# Patient Record
Sex: Male | Born: 1975 | Race: White | Hispanic: No | Marital: Married | Smoking: Current every day smoker
Health system: Southern US, Community
[De-identification: ages and names within clinical notes are randomized; demographics above are authoritative.]

## PROBLEM LIST (undated history)

## (undated) DIAGNOSIS — R569 Unspecified convulsions: Secondary | ICD-10-CM

## (undated) HISTORY — PX: SHOULDER SURGERY: SHX246

## (undated) HISTORY — PX: FRACTURE SURGERY: SHX138

## (undated) HISTORY — PX: BRAIN SURGERY: SHX531

## (undated) HISTORY — PX: BACK SURGERY: SHX140

---

## 2015-12-13 DIAGNOSIS — M25312 Other instability, left shoulder: Secondary | ICD-10-CM | POA: Insufficient documentation

## 2017-06-09 DIAGNOSIS — G4700 Insomnia, unspecified: Secondary | ICD-10-CM | POA: Insufficient documentation

## 2018-08-25 ENCOUNTER — Other Ambulatory Visit: Payer: Self-pay

## 2018-08-25 ENCOUNTER — Emergency Department (INDEPENDENT_AMBULATORY_CARE_PROVIDER_SITE_OTHER): Payer: Commercial Managed Care - PPO

## 2018-08-25 ENCOUNTER — Emergency Department
Admission: EM | Admit: 2018-08-25 | Discharge: 2018-08-25 | Disposition: A | Discharge status: Home / Self Care | Payer: Commercial Managed Care - PPO | Source: Home / Self Care

## 2018-08-25 DIAGNOSIS — S67196A Crushing injury of right little finger, initial encounter: Secondary | ICD-10-CM

## 2018-08-25 DIAGNOSIS — X58XXXA Exposure to other specified factors, initial encounter: Secondary | ICD-10-CM

## 2018-08-25 DIAGNOSIS — Z23 Encounter for immunization: Secondary | ICD-10-CM

## 2018-08-25 HISTORY — DX: Unspecified convulsions: R56.9

## 2018-08-25 MED ORDER — TETANUS-DIPHTH-ACELL PERTUSSIS 5-2.5-18.5 LF-MCG/0.5 IM SUSP
0.5000 mL | Freq: Once | INTRAMUSCULAR | Status: AC
  Administered 2018-08-25: 0.5 mL via INTRAMUSCULAR

## 2018-08-25 MED ORDER — HYDROCODONE-ACETAMINOPHEN 5-325 MG PO TABS: 1.0000 | ORAL_TABLET | Freq: Four times a day (QID) | ORAL | 0 refills | Status: DC | PRN

## 2018-08-25 MED ORDER — DOXYCYCLINE HYCLATE 100 MG PO CAPS: 100.0000 mg | ORAL_CAPSULE | Freq: Two times a day (BID) | ORAL | 0 refills | Status: AC

## 2018-08-25 NOTE — ED Triage Notes (Signed)
Pinky finger on right hand was caught in metal and split it open on Sunday.

## 2018-08-25 NOTE — ED Provider Notes (Signed)
[facility]    CSN: [id number] Arrival date & time: 08/25/18  [id number]     History   Chief Complaint Chief Complaint  Patient presents with   Finger Injury    HPI Micheal Campbell is a 43 y.o. male.   HPI Micheal Campbell is a 43 y.o. male presenting to UC with c/o gradually worsening Right little finger pain and swelling after getting his finger caught in a large metal sheet and screw 2 days ago. Pt states the screw essentially went between his finger and under his nail. He did try to gently clean the wound and applied an OTC steri-strip but his wife encouraged him to be evaluated today.  Pain is 3/10 at rest but sever whenever his finger is touched or even rinsed.  Denies fever, chills, n/v/d. He is Right hand dominant. [place].  He is unsure of his last tetanus.   Past Medical History:  Diagnosis Date   Seizures (HCC)     There are no active problems to display for this patient.   Past Surgical History:  Procedure Laterality Date   BACK SURGERY     BRAIN SURGERY     FRACTURE SURGERY     SHOULDER SURGERY         Home Medications    Prior to Admission medications   Medication Sig Start Date End Date Taking? Authorizing Provider  carbamazepine (TEGRETOL) 200 MG tablet take 1-2 tablet by mouth in the morning and 2 tablet by mouth in the evening DAILY 07/13/18   [provider]  clonazePAM (KLONOPIN) 0.5 MG tablet take 1 and one half tablet by mouth every day as needed 07/10/18   [provider]  doxycycline (VIBRAMYCIN) 100 MG capsule Take 1 capsule (100 mg total) by mouth 2 (two) times daily for 10 days. 08/25/18 09/04/18  Lurene ShadowPhelps, Missael Ferrari O, PA-C  HYDROcodone-acetaminophen (NORCO/VICODIN) 5-325 MG tablet Take 1-2 tablets by mouth every 6 (six) hours as needed. 08/25/18   Lurene ShadowPhelps, Mandeep Ferch O, PA-C  ibuprofen (ADVIL) 800 MG tablet take 1 tablet EVERY 8-12 hours as needed for elbow pain] 07/10/18    [provider]    Family History History reviewed. No pertinent family history.  Social History Social History   Tobacco Use   Smoking status: Current Every Day Smoker    Packs/day: 0.50   Smokeless tobacco: Never Used  Substance Use Topics   Alcohol use: Yes    Comment: 6 pack a week   Drug use: Not on file     Allergies   Patient has no known allergies.   Review of Systems Review of Systems  Musculoskeletal: Positive for arthralgias and joint swelling. Negative for myalgias.  Skin: Positive for color change and wound.  Neurological: Positive for weakness (Right little finger due to pain). Negative for numbness.     Physical Exam Triage Vital Signs ED Triage Vitals  Enc Vitals Group     BP 08/25/18 0909 (!) 141/80     Pulse Rate 08/25/18 0909 (!) 59     Resp 08/25/18 0909 20     Temp 08/25/18 0909 98.3 F (36.8 C)     Temp Source 08/25/18 0909 Oral     SpO2 08/25/18 0909 98 %     Weight 08/25/18 0912 223 lb (101.2 kg)     Height 08/25/18 0912 6\' 6"  (1.981 m)     Head Circumference --      Peak  Flow --      Pain Score 08/25/18 0911 3     Pain Loc --      Pain Edu? --      Excl. in [place]? --    No data found.  Updated Vital Signs BP (!) 141/80 (BP Location: Right Arm)    Pulse (!) 59    Temp 98.3 F (36.8 C) (Oral)    Resp 20    Ht 6\' 6"  (1.981 m)    Wt 223 lb (101.2 kg)    SpO2 98%    BMI 25.77 kg/m   Visual Acuity Right Eye Distance:   Left Eye Distance:   Bilateral Distance:    Right Eye Near:   Left Eye Near:    Bilateral Near:     Physical Exam Vitals signs and nursing note reviewed.  Constitutional:      Appearance: Normal appearance. He is well-developed.  HENT:     Head: Normocephalic and atraumatic.  Neck:     Musculoskeletal: Normal range of motion.  Cardiovascular:     Rate and Rhythm: Normal rate.  Pulmonary:     Effort: Pulmonary effort is normal.  Musculoskeletal:        General: Swelling, tenderness and signs of  injury present.     Right hand: He exhibits deformity. He exhibits normal capillary refill.       Hands:     Comments: Right little finger: moderate edema to distal aspect. Slight decreased flexion due to pain. Severe tenderness to distal aspect.   Skin:    General: Skin is warm and dry.     Capillary Refill: Capillary refill takes less than 2 seconds.     Comments: Right little finger, distal aspect: superficial laceration. No active bleeding. Tip of finger covered with dried grease/oil.  Significant tenderness around wound. No erythema or warmth.  Nail bed in tact.  Neurological:     General: No focal deficit present.     Mental Status: He is alert and oriented to person, place, and time.  Psychiatric:        Behavior: Behavior normal.      UC Treatments / Results  Labs (all labs ordered are listed, but only abnormal results are displayed) Labs Reviewed - No data to display  EKG   Radiology Dg Hand Complete Right  Result Date: 08/25/2018 CLINICAL DATA:  Laceration to fifth digit with pain and swelling, initial encounter EXAM: RIGHT HAND - COMPLETE 3+ VIEW COMPARISON:  None. 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 No acute fracture is seen. No dislocation is noted. No gross soft tissue abnormality or foreign body is seen. IMPRESSION: No acute abnormality noted. Electronically Signed   By: Inez Catalina M.D.   On: 08/25/2018 09:46    Procedures Laceration Repair  Date/Time: 08/25/2018 12:03 PM Performed by: Noe Gens, PA-C Authorized by: Noe Gens, PA-C   Consent:    Consent obtained:  Verbal   Consent given by:  Patient   Risks discussed:  Infection, pain, retained foreign body, poor cosmetic result, tendon damage, need for additional repair and poor wound healing   Alternatives discussed:  No treatment and delayed treatment Anesthesia (see MAR for exact dosages):    Anesthesia method:  None Laceration details:      Location:  Finger   Finger location:  R small finger   Length (cm):  2   Depth (mm):  2 Repair type:    Repair  type:  Simple Exploration:    Hemostasis achieved with:  Direct pressure   Wound exploration: wound explored through full range of motion and entire depth of wound probed and visualized     Wound extent: foreign bodies/material (questionable on imaging, however, read as normal by radiology)     Wound extent: no areolar tissue violation noted, no fascia violation noted, no muscle damage noted, no nerve damage noted, no tendon damage noted, no underlying fracture noted and no vascular damage noted     Contaminated: yes (finger tip covered in dried oil/grease?)   Treatment:    Area cleansed with:  Saline and Hibiclens   Amount of cleaning:  Standard   Irrigation solution:  Sterile saline Skin repair:    Repair method:  Steri-Strips   Number of Steri-Strips:  1 Approximation:    Approximation:  Loose Post-procedure details:    Dressing:  Bulky dressing and splint for protection (stack splint applied)   Patient tolerance of procedure:  Tolerated well, no immediate complications   (including critical care time)  Medications Ordered in UC Medications  Tdap (BOOSTRIX) injection 0.5 mL (0.5 mLs Intramuscular Given 08/25/18 0930)    Initial Impression / Assessment and Plan / UC Course  I have reviewed the triage vital signs and the nursing notes.  Pertinent labs & imaging results that were available during my care of the patient were reviewed by me and considered in my medical decision making (see chart for details).     Wound treated as noted above Reassured pt no fracture, however, this is a questionable small FB at tip of finger.  Tdap updated Will start pt on doxycycline Home care info provided.  Final Clinical Impressions(s) / UC Diagnoses   Final diagnoses:  Crushing injury of right little finger, initial encounter     Discharge Instructions      You may  take 500mg  acetaminophen every 4-6 hours or in combination with ibuprofen 400-600mg  every 6-8 hours as needed for pain and inflammation.  Norco/Vicodin (hydrocodone-acetaminophen) is a narcotic pain medication, do not combine these medications with others containing tylenol. While taking, do not drink alcohol, drive, or perform any other activities that requires focus while taking these medications.   Please take antibiotics as prescribed and be sure to complete entire course even if you start to feel better to ensure infection does not come back.  You should keep the splint on this week to help protect your finger as it heals. You may change the bandage once or twice daily, more often if the bandage becomes dirty or wet. Try to leave the steri-strip in place unless it is causing more irritation. You may then soak the steri-strip and apply neosporin to help loosen the ends, do not forcefully remove the steri-strip as this can reopen a healing wound.  Please call to schedule a follow up appointment with Sports Medicine or your Family Doctor in 1 week if not improving, sooner if worsening.     ED Prescriptions    Medication Sig Dispense Auth. Provider   doxycycline (VIBRAMYCIN) 100 MG capsule Take 1 capsule (100 mg total) by mouth 2 (two) times daily for 10 days. 20 capsule Lurene ShadowPhelps, Kathlee Barnhardt O, PA-C   HYDROcodone-acetaminophen (NORCO/VICODIN) 5-325 MG tablet Take 1-2 tablets by mouth every 6 (six) hours as needed. 8 tablet Lurene ShadowPhelps, Fateh Kindle O, PA-C     Controlled Substance Prescriptions [place] Controlled Substance Registry consulted? Yes, I have consulted the [place] Controlled Substances Registry for this patient, and feel  the risk/benefit ratio today is favorable for proceeding with this prescription for a controlled substance.   Lurene Shadowhelps, Demarquez Ciolek O, [place]PA-C 08/25/18 1206

## 2018-08-25 NOTE — Discharge Instructions (Signed)
°  You may take 500mg  acetaminophen every 4-6 hours or in combination with ibuprofen 400-600mg  every 6-8 hours as needed for pain and inflammation.  Norco/Vicodin (hydrocodone-acetaminophen) is a narcotic pain medication, do not combine these medications with others containing tylenol. While taking, do not drink alcohol, drive, or perform any other activities that requires focus while taking these medications.   Please take antibiotics as prescribed and be sure to complete entire course even if you start to feel better to ensure infection does not come back.  You should keep the splint on this week to help protect your finger as it heals. You may change the bandage once or twice daily, more often if the bandage becomes dirty or wet. Try to leave the steri-strip in place unless it is causing more irritation. You may then soak the steri-strip and apply neosporin to help loosen the ends, do not forcefully remove the steri-strip as this can reopen a healing wound.  Please call to schedule a follow up appointment with Sports Medicine or your Family Doctor in 1 week if not improving, sooner if worsening.

## 2018-10-23 ENCOUNTER — Emergency Department (INDEPENDENT_AMBULATORY_CARE_PROVIDER_SITE_OTHER): Payer: Commercial Managed Care - PPO

## 2018-10-23 ENCOUNTER — Other Ambulatory Visit: Payer: Self-pay

## 2018-10-23 ENCOUNTER — Emergency Department
Admission: EM | Admit: 2018-10-23 | Discharge: 2018-10-23 | Disposition: A | Discharge status: Home / Self Care | Payer: Commercial Managed Care - PPO | Source: Home / Self Care | Attending: Family Medicine | Admitting: Family Medicine

## 2018-10-23 ENCOUNTER — Encounter: Payer: Self-pay | Admitting: Emergency Medicine

## 2018-10-23 DIAGNOSIS — L03116 Cellulitis of left lower limb: Secondary | ICD-10-CM | POA: Diagnosis not present

## 2018-10-23 DIAGNOSIS — M79672 Pain in left foot: Secondary | ICD-10-CM

## 2018-10-23 DIAGNOSIS — M7989 Other specified soft tissue disorders: Secondary | ICD-10-CM

## 2018-10-23 MED ORDER — DOXYCYCLINE HYCLATE 100 MG PO CAPS: 100.0000 mg | ORAL_CAPSULE | Freq: Two times a day (BID) | ORAL | 0 refills | Status: AC

## 2018-10-23 MED ORDER — HYDROCODONE-ACETAMINOPHEN 5-325 MG PO TABS: 1.0000 | ORAL_TABLET | Freq: Four times a day (QID) | ORAL | 0 refills | Status: AC | PRN

## 2018-10-23 NOTE — Discharge Instructions (Signed)
Apply a warm compress (or soak foot in warm water) several times daily. May continue ibuprofen 800mg , three times daily. May apply moleskin to decrease pressure on area.  If symptoms become significantly worse during the night or over the weekend, proceed to the local emergency room.

## 2018-10-23 NOTE — ED Provider Notes (Signed)
[facility]    CSN: [id number] Arrival date & time: 10/23/18  1811      History   Chief Complaint Chief Complaint  Patient presents with  . Blister    HPI Micheal Campbell is a 43 y.o. male.   Patient climbs communication towers, and three days ago noticed a blister on the lateral aspect of his left heel.  The area has become increasingly swollen and painful.  He recalls no injury or foreign body.  The history is provided by the patient.  Foot Pain This is a new problem. Episode onset: 3 days ago. The problem occurs constantly. The problem has been gradually worsening. The symptoms are aggravated by walking and standing. Nothing relieves the symptoms. Treatments tried: Ibuprofen 800mg . The treatment provided mild relief.    Past Medical History:  Diagnosis Date  . Seizures ([facility])     There are no active problems to display for this patient.   Past Surgical History:  Procedure Laterality Date  . BACK SURGERY    . BRAIN SURGERY    . FRACTURE SURGERY    . SHOULDER SURGERY         Home Medications    Prior to Admission medications   Medication Sig Start Date End Date Taking? Authorizing Provider  carbamazepine (TEGRETOL) 200 MG tablet take 1-2 tablet by mouth in the morning and 2 tablet by mouth in the evening DAILY 07/13/18   [provider]  clonazePAM (KLONOPIN) 0.5 MG tablet take 1 and one half tablet by mouth every day as needed 07/10/18   [provider]  doxycycline (VIBRAMYCIN) 100 MG capsule Take 1 capsule (100 mg total) by mouth 2 (two) times daily. Take with food. 10/23/18   Kandra Nicolas, MD  HYDROcodone-acetaminophen (NORCO/VICODIN) 5-325 MG tablet Take 1 tablet by mouth every 6 (six) hours as needed for moderate pain or severe pain. 10/23/18   Kandra Nicolas, MD  ibuprofen (ADVIL) 800 MG tablet take 1 tablet EVERY 8-12 hours as needed for elbow pain] 07/10/18   [provider]    Family History History reviewed. No  pertinent family history.  Social History Social History   Tobacco Use  . Smoking status: Current Every Day Smoker    Packs/day: 0.50  . Smokeless tobacco: Never Used  Substance Use Topics  . Alcohol use: Yes    Comment: 6 pack a week  . Drug use: Not on file     Allergies   Patient has no known allergies.   Review of Systems Review of Systems  Skin: Positive for color change.  All other systems reviewed and are negative.    Physical Exam Triage Vital Signs ED Triage Vitals  Enc Vitals Group     BP 10/23/18 1839 118/79     Pulse Rate 10/23/18 1839 70     Resp --      Temp 10/23/18 1839 98.2 F (36.8 C)     Temp Source 10/23/18 1839 Oral     SpO2 10/23/18 1839 96 %     Weight 10/23/18 1840 222 lb (100.7 kg)     Height --      Head Circumference --      Peak Flow --      Pain Score 10/23/18 1840 9     Pain Loc --      Pain Edu? --      Excl. in [place]? --    No data found.  Updated Vital Signs BP 118/79 (  BP Location: Right Arm)   Pulse 70   Temp 98.2 F (36.8 C) (Oral)   Wt 100.7 kg   SpO2 96%   BMI 25.65 kg/m   Visual Acuity Right Eye Distance:   Left Eye Distance:   Bilateral Distance:    Right Eye Near:   Left Eye Near:    Bilateral Near:     Physical Exam Vitals signs and nursing note reviewed.  Constitutional:      General: He is not in acute distress. Eyes:     Pupils: Pupils are equal, round, and reactive to light.  Cardiovascular:     Rate and Rhythm: Normal rate.  Pulmonary:     Effort: Pulmonary effort is normal.  Musculoskeletal:       Feet:     Comments: Left lateral foot has a 2cm diameter erythematous indurated area with small central hematoma, tender to palpation.  No fluctuance.  Skin:    General: Skin is warm and dry.  Neurological:     Mental Status: He is alert.      UC Treatments / Results  Labs (all labs ordered are listed, but only abnormal results are displayed) Labs Reviewed - No data to display  EKG    Radiology Dg Foot Complete Left  Result Date: 10/23/2018 CLINICAL DATA:  Left foot pain and swelling for 3 days. EXAM: LEFT FOOT - COMPLETE 3+ VIEW COMPARISON:  None. FINDINGS: There is no evidence of fracture or dislocation. Minimal spurring at the first metatarsal phalangeal joint. There is no other evidence of arthropathy or other focal bone abnormality. Mild lateral soft tissue edema. No radiopaque foreign body or soft tissue air IMPRESSION: Mild lateral soft tissue edema. No acute osseous abnormality. Electronically Signed   By: Narda Rutherford M.D.   On: 10/23/2018 19:18    Procedures Procedures (including critical care time)  Medications Ordered in UC Medications - No data to display  Initial Impression / Assessment and Plan / UC Course  I have reviewed the triage vital signs and the nursing notes.  Pertinent labs & imaging results that were available during my care of the patient were reviewed by me and considered in my medical decision making (see chart for details).    No evidence foreign body.  Begin doxycycline for staph coverage. Rx for Lortab (#10, no refill). Controlled Substance Prescriptions I have consulted the [place] Controlled Substances Registry for this patient, and feel the risk/benefit ratio today is favorable for proceeding with this prescription for a controlled substance.   Followup with Family Doctor if not improved in one week.    Final Clinical Impressions(s) / UC Diagnoses   Final diagnoses:  Cellulitis of left foot     Discharge Instructions     Apply a warm compress (or soak foot in warm water) several times daily. May continue ibuprofen 800mg , three times daily. May apply moleskin to decrease pressure on area.  If symptoms become significantly worse during the night or over the weekend, proceed to the local emergency room.     ED Prescriptions    Medication Sig Dispense Auth. Provider   HYDROcodone-acetaminophen (NORCO/VICODIN) 5-325 MG  tablet Take 1 tablet by mouth every 6 (six) hours as needed for moderate pain or severe pain. 10 tablet Lattie Haw, MD   doxycycline (VIBRAMYCIN) 100 MG capsule Take 1 capsule (100 mg total) by mouth 2 (two) times daily. Take with food. 20 capsule Lattie Haw, MD  Lattie HawBeese,  A, MD 10/23/18 1949

## 2018-10-23 NOTE — ED Triage Notes (Signed)
Pt c/o blister on his left lateral side of foot x3 days. States pain is worsening. Denies known injury.

## 2018-10-25 ENCOUNTER — Encounter: Payer: Self-pay | Admitting: Emergency Medicine

## 2018-10-25 ENCOUNTER — Other Ambulatory Visit: Payer: Self-pay

## 2018-10-25 ENCOUNTER — Emergency Department (INDEPENDENT_AMBULATORY_CARE_PROVIDER_SITE_OTHER)
Admission: EM | Admit: 2018-10-25 | Discharge: 2018-10-25 | Disposition: A | Discharge status: Home / Self Care | Payer: Commercial Managed Care - PPO | Source: Home / Self Care | Attending: Family Medicine | Admitting: Family Medicine

## 2018-10-25 ENCOUNTER — Telehealth: Payer: Self-pay | Admitting: Emergency Medicine

## 2018-10-25 DIAGNOSIS — Z5189 Encounter for other specified aftercare: Secondary | ICD-10-CM

## 2018-10-25 MED ORDER — CLINDAMYCIN HCL 300 MG PO CAPS: ORAL_CAPSULE | ORAL | 0 refills | Status: DC

## 2018-10-25 NOTE — ED Provider Notes (Signed)
[facility]    CSN: [id number] Arrival date & time: 10/25/18  1513      History   Chief Complaint Chief Complaint  Patient presents with  . Wound Check    HPI Micheal Campbell is a 43 y.o. male.   Patient returns for follow-up of cellulitis on his left lateral foot.  Since starting antibiotic he has had increasing pain, redness, and swelling.  There has been no drainage from the wound.   Abscess Location:  Foot Foot abscess location:  L heel Abscess quality: fluctuance, painful, redness and warmth   Abscess quality: not draining (1.5cm), no induration and not weeping   Red streaking: no   Progression:  Worsening Pain details:    Quality:  Aching and pressure   Severity:  Severe   Duration:  5 days   Timing:  Constant   Progression:  Worsening Chronicity:  New Context: not insect bite/sting and not skin injury   Relieved by:  Nothing Exacerbated by: palpation. Ineffective treatments:  Oral antibiotics Associated symptoms: no fatigue, no fever and no nausea   Risk factors: no prior abscess     Past Medical History:  Diagnosis Date  . Seizures (HCC)     There are no active problems to display for this patient.   Past Surgical History:  Procedure Laterality Date  . BACK SURGERY    . BRAIN SURGERY    . FRACTURE SURGERY    . SHOULDER SURGERY         Home Medications    Prior to Admission medications   Medication Sig Start Date End Date Taking? Authorizing Provider  carbamazepine (TEGRETOL) 200 MG tablet take 1-2 tablet by mouth in the morning and 2 tablet by mouth in the evening DAILY 07/13/18   [provider]  clindamycin (CLEOCIN) 300 MG capsule Take one cap PO Q8hr 10/25/18   Lattie HawBeese, Stephen A, MD  clonazePAM (KLONOPIN) 0.5 MG tablet take 1 and one half tablet by mouth every day as needed 07/10/18   [provider]  doxycycline (VIBRAMYCIN) 100 MG capsule Take 1 capsule (100 mg total) by mouth 2 (two) times daily. Take with  food. 10/23/18   Lattie HawBeese, Stephen A, MD  HYDROcodone-acetaminophen (NORCO/VICODIN) 5-325 MG tablet Take 1 tablet by mouth every 6 (six) hours as needed for moderate pain or severe pain. 10/23/18   Lattie HawBeese, Stephen A, MD  ibuprofen (ADVIL) 800 MG tablet take 1 tablet EVERY 8-12 hours as needed for elbow pain] 07/10/18   [provider]    Family History No family history on file.  Social History Social History   Tobacco Use  . Smoking status: Current Every Day Smoker    Packs/day: 0.50  . Smokeless tobacco: Never Used  Substance Use Topics  . Alcohol use: Yes    Comment: 6 pack a week  . Drug use: Not on file     Allergies   Patient has no known allergies.   Review of Systems Review of Systems  Constitutional: Negative for fatigue and fever.  Gastrointestinal: Negative for nausea.  All other systems reviewed and are negative.    Physical Exam Triage Vital Signs ED Triage Vitals  Enc Vitals Group     BP 10/25/18 1519 131/77     Pulse Rate 10/25/18 1519 80     Resp 10/25/18 1519 16     Temp 10/25/18 1519 98.2 F (36.8 C)     Temp Source 10/25/18 1519 Oral     SpO2  10/25/18 1519 99 %     Weight 10/25/18 1519 228 lb (103.4 kg)     Height 10/25/18 1519 6\' 6"  (1.981 m)     Head Circumference --      Peak Flow --      Pain Score 10/25/18 1525 1     Pain Loc --      Pain Edu? --      Excl. in [place]? --    No data found.  Updated Vital Signs BP 131/77 (BP Location: Right Arm)   Pulse 80   Temp 98.2 F (36.8 C) (Oral)   Resp 18   Ht 6\' 6"  (1.981 m)   Wt 103.4 kg   SpO2 99%   BMI 26.35 kg/m   Visual Acuity Right Eye Distance:   Left Eye Distance:   Bilateral Distance:    Right Eye Near:   Left Eye Near:    Bilateral Near:     Physical Exam Vitals signs and nursing note reviewed.  Constitutional:      General: He is not in acute distress. Eyes:     Pupils: Pupils are equal, round, and reactive to light.  Cardiovascular:     Rate and Rhythm: Normal  rate.  Pulmonary:     Effort: Pulmonary effort is normal.  Musculoskeletal:     Left foot: Tenderness and swelling present.       Feet:     Comments: Left lateral foot/heel has a 1.5cm diameter vesicle containing serosanguinous fluid and surrounding erythema/tenderness.  Neurological:     Mental Status: He is alert.      UC Treatments / Results  Labs (all labs ordered are listed, but only abnormal results are displayed) Labs Reviewed  WOUND CULTURE    EKG   Radiology Dg Foot Complete Left  Result Date: 10/23/2018 CLINICAL DATA:  Left foot pain and swelling for 3 days. EXAM: LEFT FOOT - COMPLETE 3+ VIEW COMPARISON:  None. FINDINGS: There is no evidence of fracture or dislocation. Minimal spurring at the first metatarsal phalangeal joint. There is no other evidence of arthropathy or other focal bone abnormality. Mild lateral soft tissue edema. No radiopaque foreign body or soft tissue air IMPRESSION: Mild lateral soft tissue edema. No acute osseous abnormality. Electronically Signed   By: Narda Rutherford M.D.   On: 10/23/2018 19:18    Procedures Procedures  Incise and drain cyst/abscess Risks and benefits of procedure explained to patient and verbal consent obtained.  Using sterile technique, applied topical refrigerant spray followed by local anesthesia with 1% lidocaine with epinephrine, and cleansed affected area with Betadine and alcohol. Identified the most fluctuant area of lesion and incised with #11 blade.  Expressed small amount of blood and purulent material. Wound very shallow and no indication for packing. Bandage applied.  Patient tolerated well   Medications Ordered in UC Medications - No data to display  Initial Impression / Assessment and Plan / UC Course  I have reviewed the triage vital signs and the nursing notes.  Pertinent labs & imaging results that were available during my care of the patient were reviewed by me and considered in my medical decision  making (see chart for details).    No evidence of foreign body as a cause of his skin abscess.  Wound culture pending. Begin clindamycin. Followup with Family Doctor if not improved in about 8 days. Final Clinical Impressions(s) / UC Diagnoses   Final diagnoses:  Visit for wound check     Discharge  Instructions     Apply heating pad 2 or 3 times daily.  Elevate foot as much as possible.  Change bandage daily. Discontinue doxycycline.  If symptoms become significantly worse during the night or over the weekend, proceed to the local emergency room.     ED Prescriptions    Medication Sig Dispense Auth. Provider   clindamycin (CLEOCIN) 300 MG capsule Take one cap PO Q8hr 30 capsule Kandra Nicolas, MD         Kandra Nicolas, MD 10/29/18 1517

## 2018-10-25 NOTE — Discharge Instructions (Signed)
Apply heating pad 2 or 3 times daily.  Elevate foot as much as possible.  Change bandage daily. Discontinue doxycycline.  If symptoms become significantly worse during the night or over the weekend, proceed to the local emergency room.

## 2018-10-25 NOTE — ED Triage Notes (Signed)
Patient called an hour ago to report the lesion on his left lateral foot has worsened; he was seen here 2 days ago and placed on antibiotic, which he has been taking. Dr.Beese suggested he come in for a re-evaluation.

## 2018-10-29 LAB — WOUND CULTURE
MICRO NUMBER:: 877419
SPECIMEN QUALITY:: ADEQUATE

## 2018-10-30 ENCOUNTER — Telehealth (HOSPITAL_COMMUNITY): Payer: Self-pay | Admitting: Emergency Medicine

## 2018-10-30 NOTE — Telephone Encounter (Signed)
Contacted patient, is currently taking clindamycin with much improvement. All questions answered.

## 2019-11-08 ENCOUNTER — Emergency Department (INDEPENDENT_AMBULATORY_CARE_PROVIDER_SITE_OTHER)
Admission: EM | Admit: 2019-11-08 | Discharge: 2019-11-08 | Disposition: A | Discharge status: Home / Self Care | Payer: Managed Care, Other (non HMO) | Source: Home / Self Care

## 2019-11-08 DIAGNOSIS — L03115 Cellulitis of right lower limb: Secondary | ICD-10-CM

## 2019-11-08 DIAGNOSIS — L0201 Cutaneous abscess of face: Secondary | ICD-10-CM

## 2019-11-08 MED ORDER — CLINDAMYCIN HCL 300 MG PO CAPS: 300.0000 mg | ORAL_CAPSULE | Freq: Three times a day (TID) | ORAL | 0 refills | Status: AC

## 2019-11-08 NOTE — Discharge Instructions (Signed)
  You may take 500mg  acetaminophen every 4-6 hours or in combination with ibuprofen 400-600mg  every 6-8 hours as needed for pain, inflammation, and fever.  Please take antibiotics as prescribed and be sure to complete entire course even if you start to feel better to ensure infection does not come back.  Follow up in 3-4 days if not improving.

## 2019-11-08 NOTE — ED Provider Notes (Signed)
[facility]    CSN: [id number] Arrival date & time: 11/08/19  1844      History   Chief Complaint Chief Complaint  Patient presents with  . Abscess    HPI Micheal Campbell is a 44 y.o. male.   HPI  Micheal Campbell is a 44 y.o. male presenting to UC with c/o 4 days of gradually worsening redness, pain and swelling on Right lower leg. Hx of abscesses in the past. He reports having a smaller pimple type sore on left side of face but he was able to "pop" that one and reports purulent drainage from that. The facial abscess feels much better since it drained last night. Denies fever, chills, n/v/d. Pt leaves for the beach later this week.  Requesting a work note because sore on his leg is painful with his work boots.    Past Medical History:  Diagnosis Date  . Seizures [facility])     Patient Active Problem List   Diagnosis Date Noted  . Astigmatism 11/09/2019  . Persistent insomnia 06/09/2017  . 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 Back pain 12/04/2010  . Epilepsy (HCC) 12/04/2010    Past Surgical History:  Procedure Laterality Date  . BACK SURGERY    . BRAIN SURGERY    . FRACTURE SURGERY    . SHOULDER SURGERY         Home Medications    Prior to Admission medications   Medication Sig Start Date End Date Taking? Authorizing Provider  clonazePAM (KLONOPIN) 0.5 MG tablet take 1 and one half tablet by mouth every day as needed 07/10/18  Yes [provider]  carbamazepine (TEGRETOL) 200 MG tablet take 1-2 tablet by mouth in the morning and 2 tablet by mouth in the evening DAILY 07/13/18   [provider]  clindamycin (CLEOCIN) 300 MG capsule Take 1 capsule (300 mg total) by mouth 3 (three) times daily for 7 days. X 7 days 11/08/19 11/15/19  Lurene Shadow, PA-C  doxycycline (VIBRAMYCIN) 100 MG capsule Take 1 capsule (100 mg total) by mouth 2 (two) times daily. Take with food. 10/23/18   Lattie Haw, MD  HYDROcodone-acetaminophen  (NORCO/VICODIN) 5-325 MG tablet Take 1 tablet by mouth every 6 (six) hours as needed for moderate pain or severe pain. 10/23/18   Lattie Haw, MD  ibuprofen (ADVIL) 800 MG tablet take 1 tablet EVERY 8-12 hours as needed for elbow pain] 07/10/18   [provider]    Family History Family History  Problem Relation Age of Onset  . Diabetes Mother   . Hypertension Mother   . Diabetes Father     Social History Social History   Tobacco Use  . Smoking status: Current Every Day Smoker    Packs/day: 0.10  . Smokeless tobacco: Never Used  Vaping Use  . Vaping Use: Unknown  Substance Use Topics  . Alcohol use: Yes    Comment: 6 pack a week  . Drug use: Not Currently     Allergies   Other   Review of Systems Review of Systems  Constitutional: Negative for chills and fever.  Skin: Positive for color change. Negative for wound.     Physical Exam Triage Vital Signs ED Triage Vitals  Enc Vitals Group     BP 11/08/19 2052 117/71     Pulse Rate 11/08/19 2052 69     Resp 11/08/19 2052 16     Temp 11/08/19 2052 98.2 F (36.8 C)  Temp Source 11/08/19 2052 Oral     SpO2 11/08/19 2052 98 %     Weight --      Height --      Head Circumference --      Peak Flow --      Pain Score 11/08/19 2048 7     Pain Loc --      Pain Edu? --      Excl. in GC? --    No data found.  Updated Vital Signs BP 117/71 (BP Location: Right Arm)   Pulse 69   Temp 98.2 F (36.8 C) (Oral)   Resp 16   SpO2 98%   Visual Acuity Right Eye Distance:   Left Eye Distance:   Bilateral Distance:    Right Eye Near:   Left Eye Near:    Bilateral Near:     Physical Exam Vitals and nursing note reviewed.  Constitutional:      Appearance: Normal appearance. He is well-developed.  HENT:     Head: Normocephalic and atraumatic.   Cardiovascular:     Rate and Rhythm: Normal rate.  Pulmonary:     Effort: Pulmonary effort is normal.  Musculoskeletal:        General: Normal range of  motion.     Cervical back: Normal range of motion.  Skin:    General: Skin is warm and dry.     Findings: Erythema present.       Neurological:     Mental Status: He is alert and oriented to person, place, and time.  Psychiatric:        Behavior: Behavior normal.      UC Treatments / Results  Labs (all labs ordered are listed, but only abnormal results are displayed) Labs Reviewed - No data to display  EKG   Radiology No results found.  Procedures Procedures (including critical care time)  Medications Ordered in UC Medications - No data to display  Initial Impression / Assessment and Plan / UC Course  I have reviewed the triage vital signs and the nursing notes.  Pertinent labs & imaging results that were available during my care of the patient were reviewed by me and considered in my medical decision making (see chart for details).    Skin sore on Left side of face and early abscess on Right lower leg No indication for I&D at this time. Encouraged warm compresses, will start pt on doxycycline. Encouraged to apply gauze and bandage over sore spot at work to protect from his boots. F/u in 3-4 days if needed, I&D may be indicated at that time.  AVS given  Final Clinical Impressions(s) / UC Diagnoses   Final diagnoses:  Cellulitis of right lower leg  Abscess of face     Discharge Instructions      You may take 500mg  acetaminophen every 4-6 hours or in combination with ibuprofen 400-600mg  every 6-8 hours as needed for pain, inflammation, and fever.  Please take antibiotics as prescribed and be sure to complete entire course even if you start to feel better to ensure infection does not come back.  Follow up in 3-4 days if not improving.     ED Prescriptions    Medication Sig Dispense Auth. Provider   clindamycin (CLEOCIN) 300 MG capsule Take 1 capsule (300 mg total) by mouth 3 (three) times daily for 7 days. X 7 days 21 capsule , Lurene Shadow      PDMP not reviewed this encounter.  Lurene Shadow, [place] 11/11/19 [phone]

## 2019-11-08 NOTE — ED Triage Notes (Signed)
Patient presents to Urgent Care with complaints of right ankle abscess/ wound and ankle swelling since 4 days ago. Patient reports he also has a spot on the left side of his face, thought it was a pimple but it has not gotten better. Area on face has been draining but not the area on his ankle.

## 2019-11-09 ENCOUNTER — Other Ambulatory Visit: Payer: Self-pay

## 2019-11-09 ENCOUNTER — Emergency Department (INDEPENDENT_AMBULATORY_CARE_PROVIDER_SITE_OTHER)
Admission: EM | Admit: 2019-11-09 | Discharge: 2019-11-09 | Disposition: A | Discharge status: Home / Self Care | Payer: Managed Care, Other (non HMO) | Source: Home / Self Care | Attending: Family Medicine | Admitting: Family Medicine

## 2019-11-09 DIAGNOSIS — H52209 Unspecified astigmatism, unspecified eye: Secondary | ICD-10-CM | POA: Insufficient documentation

## 2019-11-09 DIAGNOSIS — L03115 Cellulitis of right lower limb: Secondary | ICD-10-CM

## 2019-11-09 NOTE — Discharge Instructions (Addendum)
Begin Clindamycin as soon as possible.  Elevate leg as much as possible.  Wynelle Link exposure is OK.  Increase fluid intake. If drainage begins to occur, keep wound covered with bandage.

## 2019-11-09 NOTE — ED Provider Notes (Signed)
[facility]    CSN: [id number] Arrival date & time: 11/09/19  0947      History   Chief Complaint Chief Complaint  Patient presents with  . Cellulitis    right lower extremity    HPI Micheal Campbell is a 44 y.o. male.   Patient was seen yesterday for cellulitis of his right lower leg and prescribed clindamycin.  He returns for a wound re-check, reporting that he has had localized increased pain and redness. He has not yet started clindamycin because pharmacy was closed last night.  He feels well otherwise and denies fevers, chills, and sweats.  The history is provided by the patient.    Past Medical History:  Diagnosis Date  . Seizures [facility])     Patient Active Problem List   Diagnosis Date Noted  . Astigmatism 11/09/2019  . Persistent insomnia 06/09/2017  . 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 Back pain 12/04/2010  . Epilepsy (HCC) 12/04/2010    Past Surgical History:  Procedure Laterality Date  . BACK SURGERY    . BRAIN SURGERY    . FRACTURE SURGERY    . SHOULDER SURGERY         Home Medications    Prior to Admission medications   Medication Sig Start Date End Date Taking? Authorizing Provider  ibuprofen (ADVIL) 800 MG tablet take 1 tablet EVERY 8-12 hours as needed for elbow pain] 07/10/18  Yes [provider]  carbamazepine (TEGRETOL) 200 MG tablet take 1-2 tablet by mouth in the morning and 2 tablet by mouth in the evening DAILY 07/13/18   [provider]  clindamycin (CLEOCIN) 300 MG capsule Take 1 capsule (300 mg total) by mouth 3 (three) times daily for 7 days. X 7 days 11/08/19 11/15/19  Lurene Shadow, PA-C  clonazePAM (KLONOPIN) 0.5 MG tablet take 1 and one half tablet by mouth every day as needed 07/10/18   [provider]  doxycycline (VIBRAMYCIN) 100 MG capsule Take 1 capsule (100 mg total) by mouth 2 (two) times daily. Take with food. 10/23/18   Lattie Haw, MD  HYDROcodone-acetaminophen  (NORCO/VICODIN) 5-325 MG tablet Take 1 tablet by mouth every 6 (six) hours as needed for moderate pain or severe pain. 10/23/18   Lattie Haw, MD    Family History Family History  Problem Relation Age of Onset  . Diabetes Mother   . Hypertension Mother   . Diabetes Father     Social History Social History   Tobacco Use  . Smoking status: Current Every Day Smoker    Packs/day: 0.10  . Smokeless tobacco: Never Used  Vaping Use  . Vaping Use: Unknown  Substance Use Topics  . Alcohol use: Yes    Comment: 6 pack a week  . Drug use: Not Currently     Allergies   Other   Review of Systems Review of Systems  Constitutional: Negative.   Skin: Positive for color change.  All other systems reviewed and are negative.    Physical Exam Triage Vital Signs ED Triage Vitals  Enc Vitals Group     BP 11/09/19 1028 117/84     Pulse Rate 11/09/19 1028 69     Resp 11/09/19 1028 15     Temp 11/09/19 1028 98.2 F (36.8 C)     Temp Source 11/09/19 1028 Oral     SpO2 11/09/19 1028 98 %     Weight 11/09/19 1032 235 lb (106.6 kg)  Height 11/09/19 1032 6\' 6"  (1.981 m)     Head Circumference --      Peak Flow --      Pain Score 11/09/19 1031 4     Pain Loc --      Pain Edu? --      Excl. in GC? --    No data found.  Updated Vital Signs BP 117/84 (BP Location: Right Arm)   Pulse 69   Temp 98.2 F (36.8 C) (Oral)   Resp 15   Ht 6\' 6"  (1.981 m)   Wt 106.6 kg   SpO2 98%   BMI 27.16 kg/m   Visual Acuity Right Eye Distance:   Left Eye Distance:   Bilateral Distance:    Right Eye Near:   Left Eye Near:    Bilateral Near:     Physical Exam Vitals and nursing note reviewed.  Constitutional:      General: He is not in acute distress. Pulmonary:     Effort: Pulmonary effort is normal.  Musculoskeletal:       Legs:     Comments: Localized erythema and tenderness to palpation right lower pretibial area as described in previous note. Right lower leg surrounding  wound slightly warm and mildly tender to palpation.  Neurological:     Mental Status: He is alert.      UC Treatments / Results  Labs (all labs ordered are listed, but only abnormal results are displayed) Labs Reviewed - No data to display  EKG   Radiology No results found.  Procedures Procedures (including critical care time)  Medications Ordered in UC Medications - No data to display  Initial Impression / Assessment and Plan / UC Course  I have reviewed the triage vital signs and the nursing notes.  Pertinent labs & imaging results that were available during my care of the patient were reviewed by me and considered in my medical decision making (see chart for details).    No change in treatment plan.   Final Clinical Impressions(s) / UC Diagnoses   Final diagnoses:  Cellulitis of right anterior lower leg     Discharge Instructions     Begin Clindamycin as soon as possible.  Elevate leg as much as possible.  11/11/19 exposure is OK.  Increase fluid intake. If drainage begins to occur, keep wound covered with bandage.    ED Prescriptions    None        , MD 11/11/19 2118

## 2019-11-09 NOTE — ED Triage Notes (Signed)
Pt here this am for wound check, increased pain & redness to RLE from cellulitis RLE painful to walk on  Pt has not started antibiotics - pharmacy was closed last night -did not p/u this am Pt needs a work note  Pt concerned about wearing a Artist, wonders if boot caused irritation  Pt had a spot on his left side of face which popped & had yellow drainage No COVID vaccine

## 2020-07-14 IMAGING — DX RIGHT HAND - COMPLETE 3+ VIEW
3 series · 3 of 3 positions shown · non-contrast
Comparison: None.

CLINICAL DATA: Laceration to fifth digit with pain and swelling,
initial encounter

EXAM:
RIGHT HAND - COMPLETE 3+ VIEW

[hand pa]
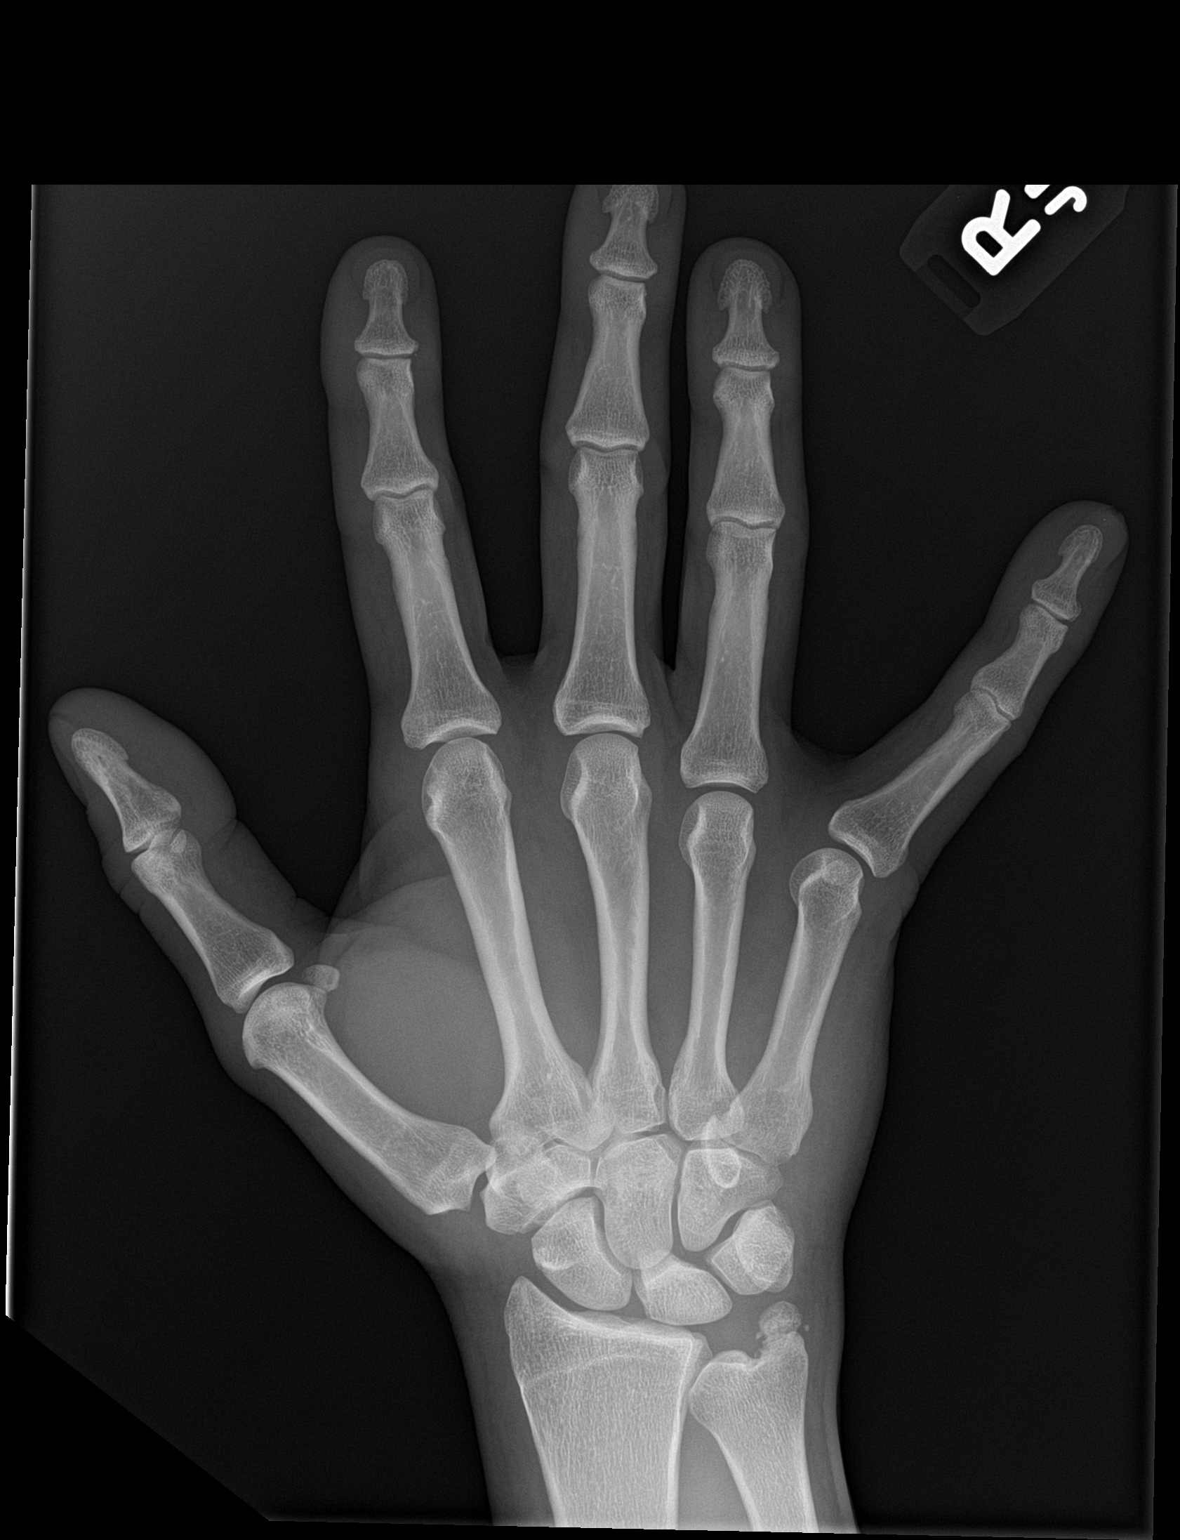

[hand obl]
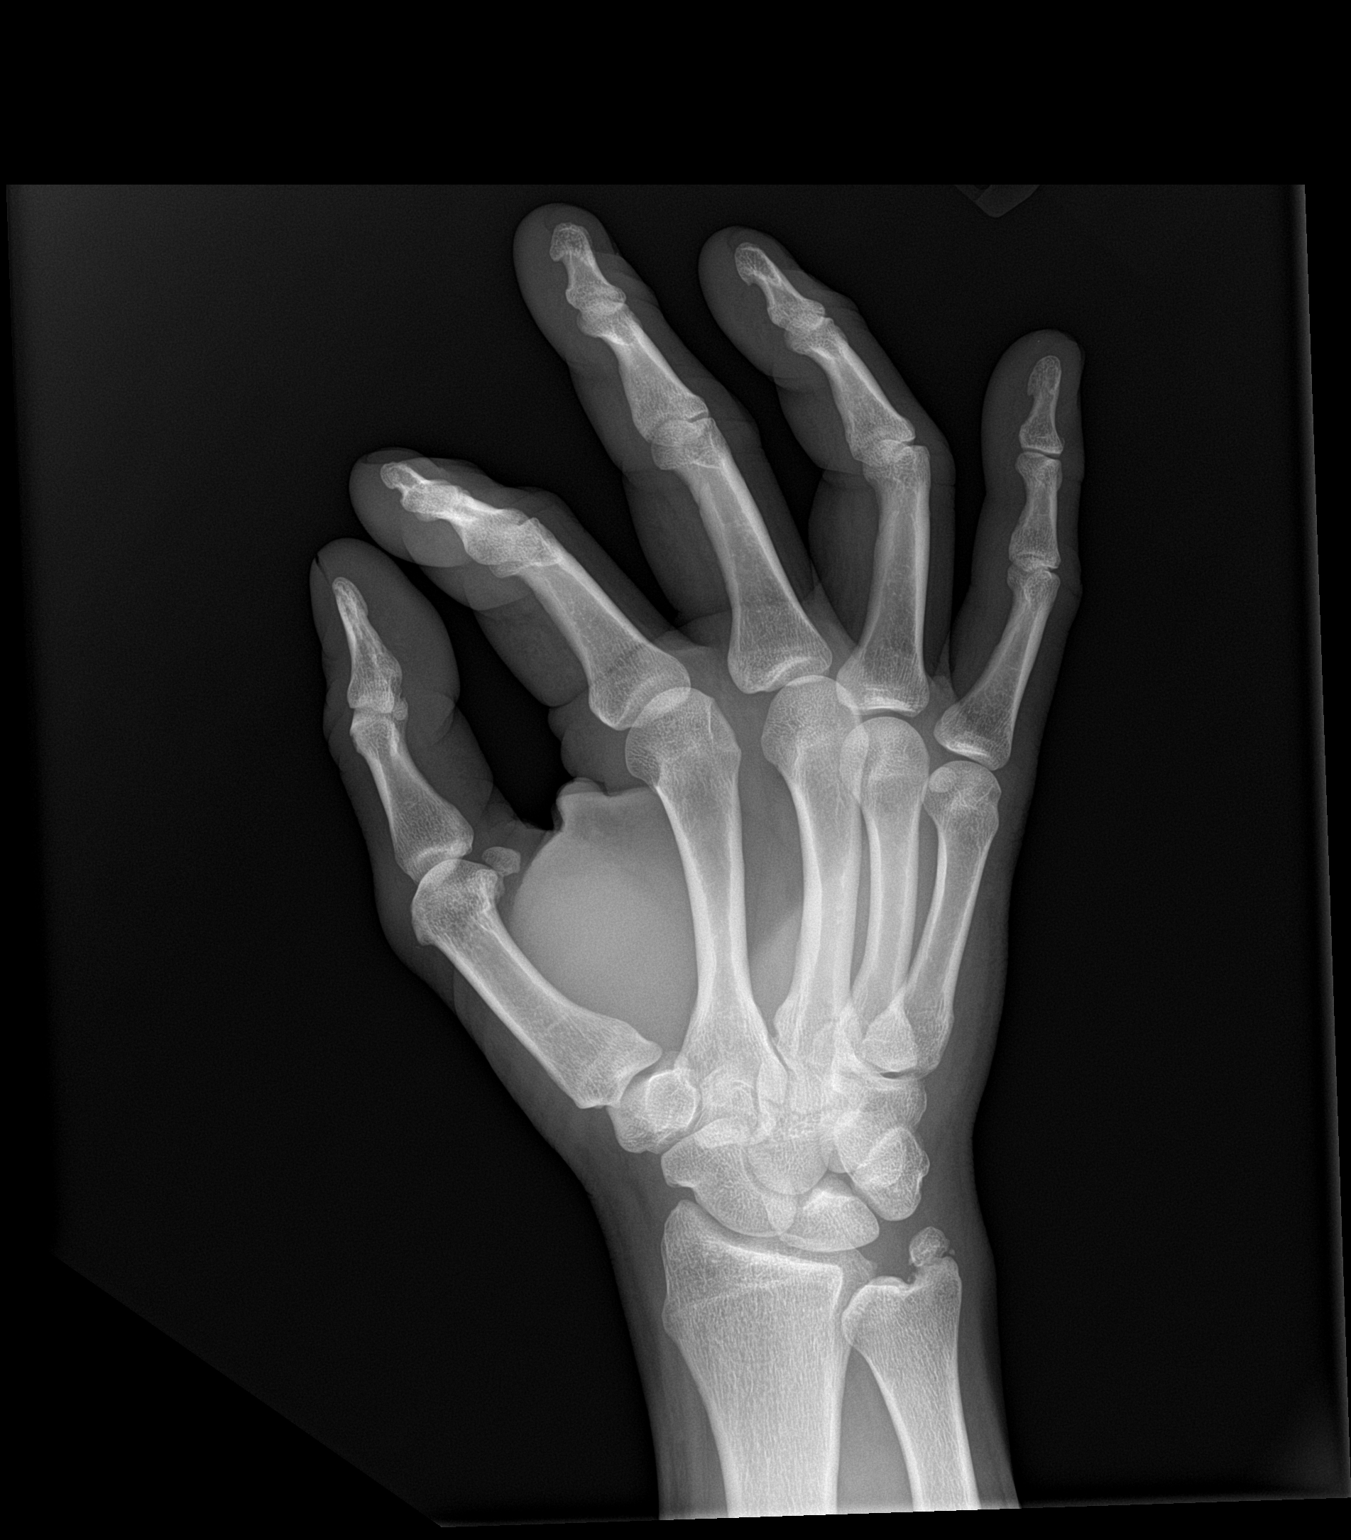

[hand lat]
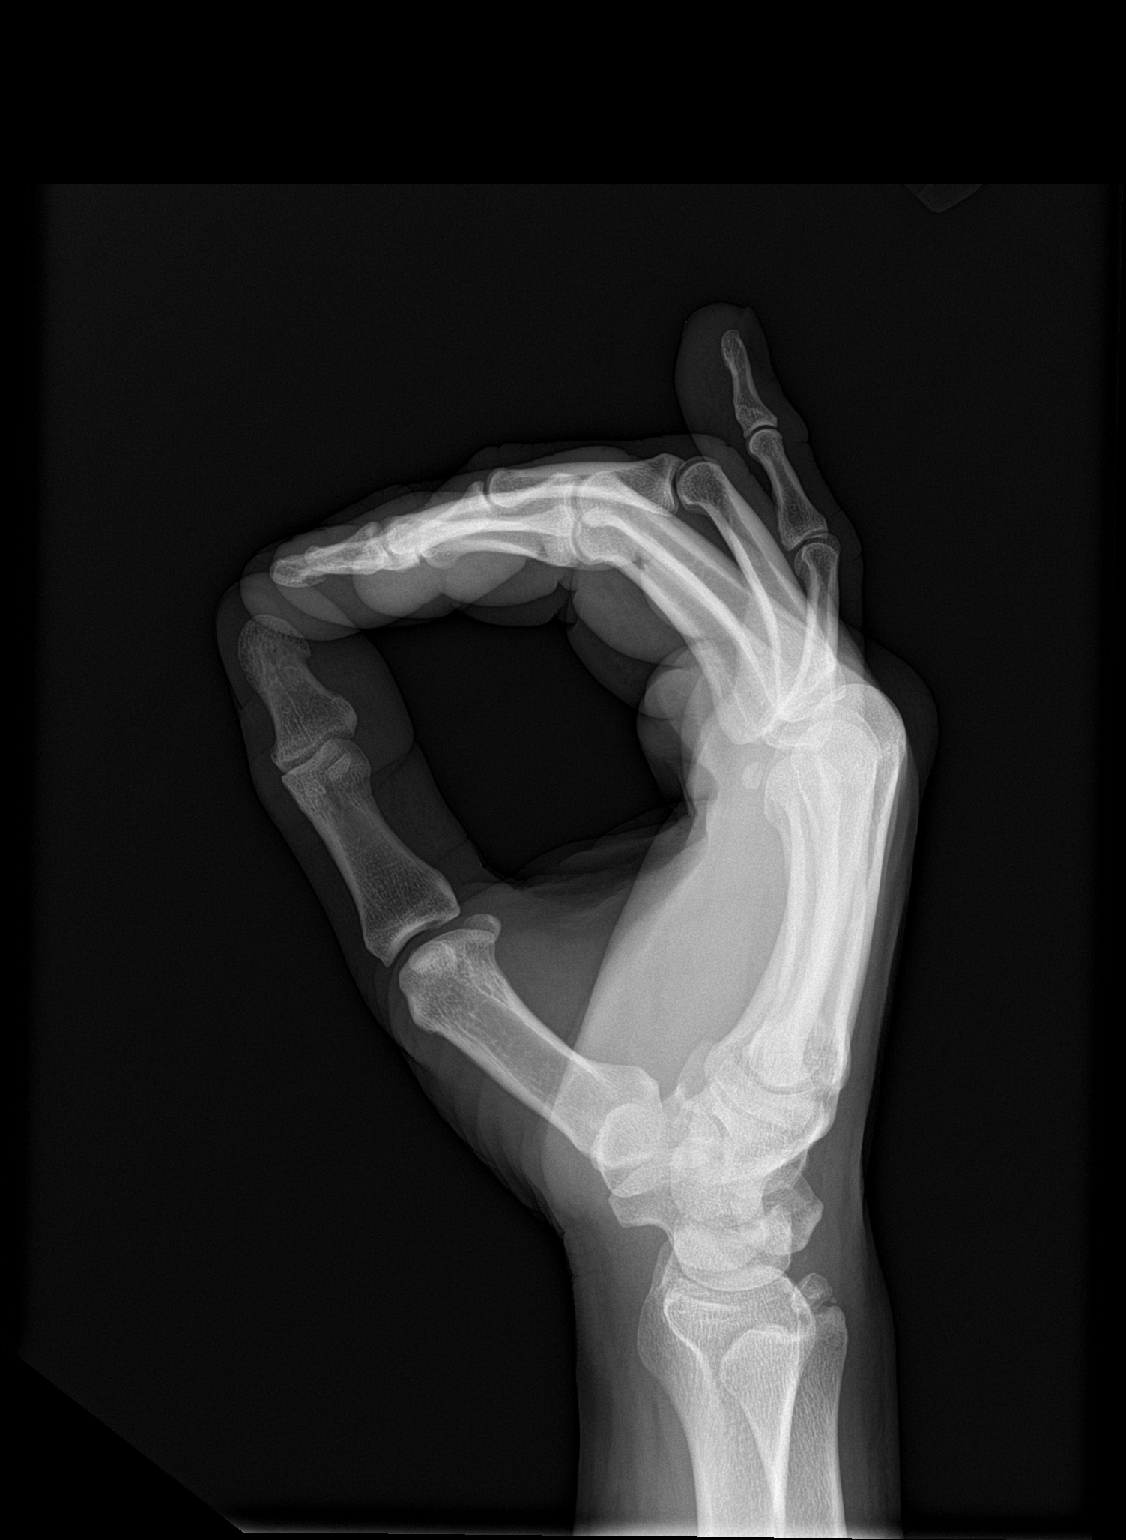

[3 of 3 positions shown; findings below may reference images not displayed]

FINDINGS: There are changes consistent with prior ulnar styloid fracture with
nonunion. No acute fracture is seen. No dislocation is noted. No
gross soft tissue abnormality or foreign body is seen.
IMPRESSION: No acute abnormality noted.

## 2020-09-11 IMAGING — DX DG FOOT COMPLETE 3+V*L*
3 series · 3 of 3 positions shown · non-contrast
Comparison: None.

CLINICAL DATA: Left foot pain and swelling for 3 days.

EXAM:
LEFT FOOT - COMPLETE 3+ VIEW

[foot ap]
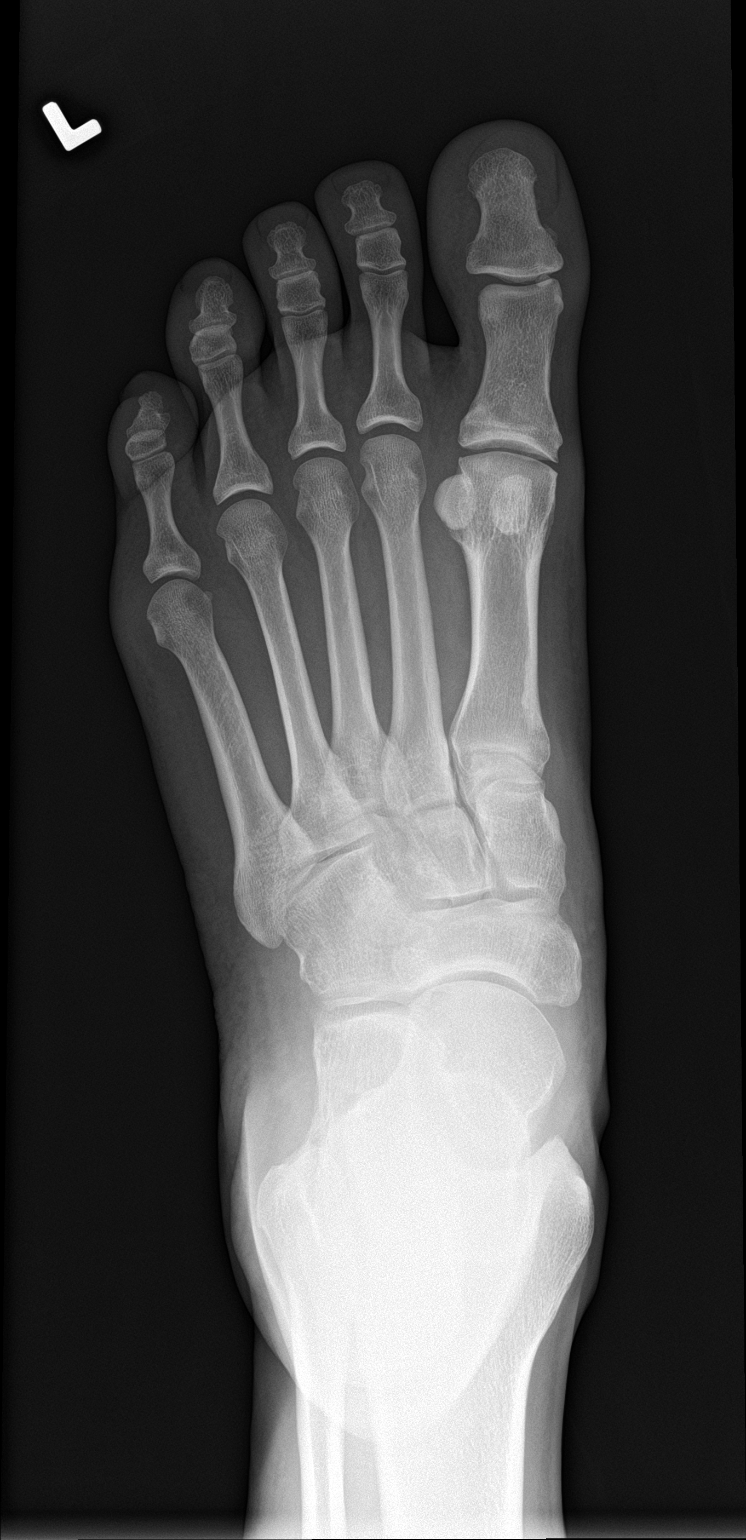

[foot obl]
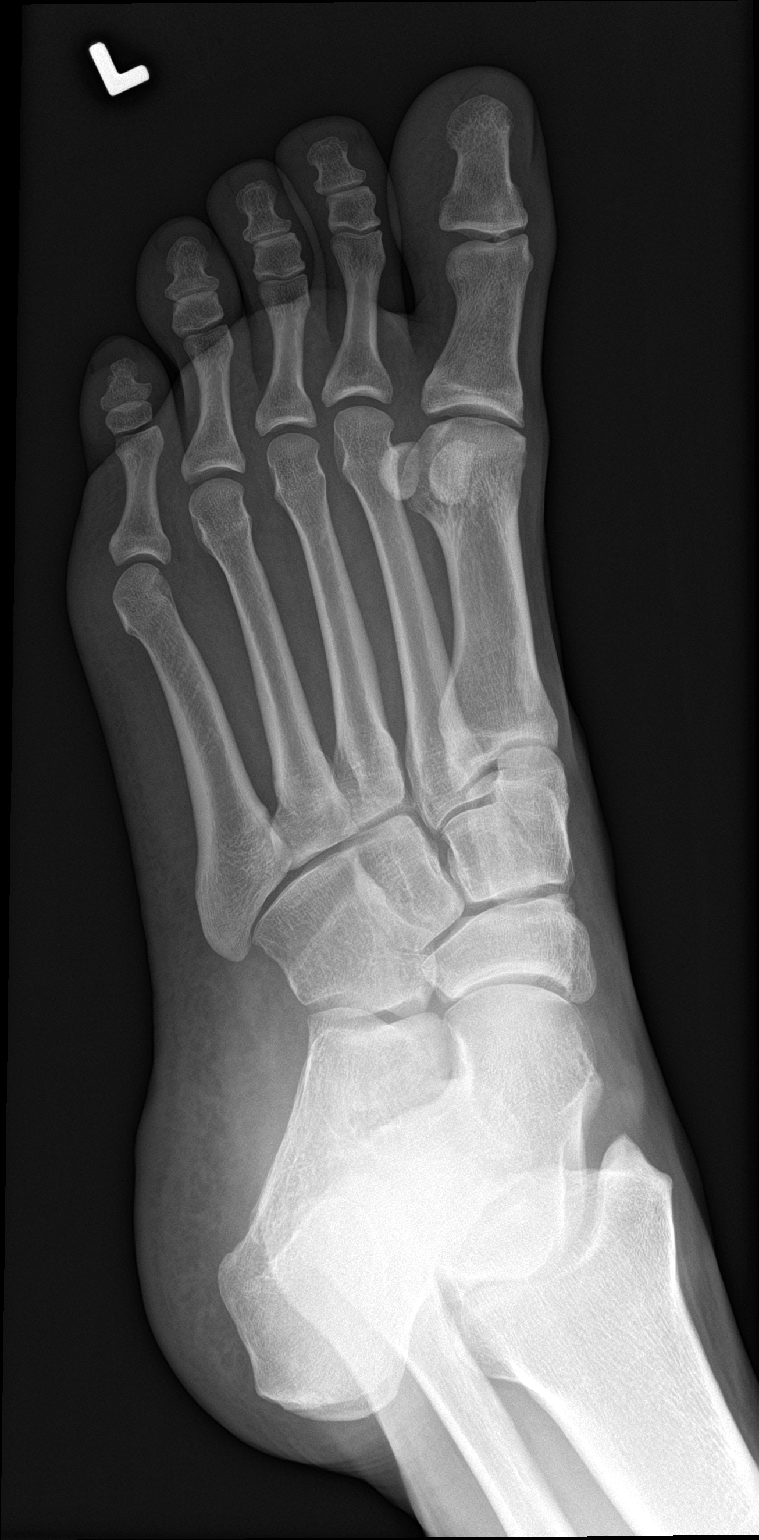

[foot lat]
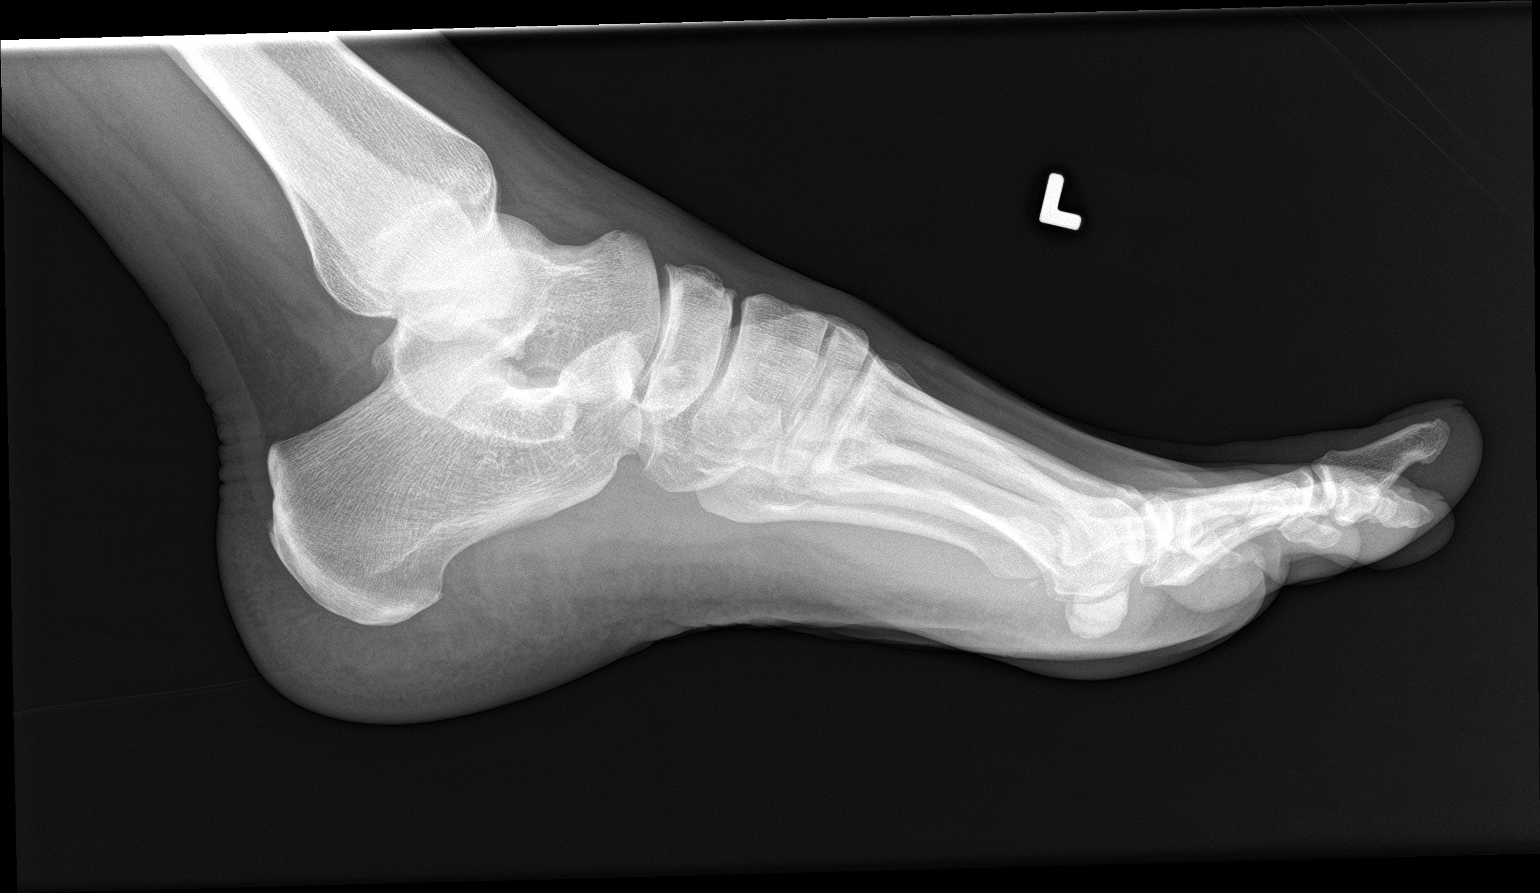

[3 of 3 positions shown; findings below may reference images not displayed]

FINDINGS: There is no evidence of fracture or dislocation. Minimal spurring at
the first metatarsal phalangeal joint. There is no other evidence of
arthropathy or other focal bone abnormality. Mild lateral soft
tissue edema. No radiopaque foreign body or soft tissue air
IMPRESSION: Mild lateral soft tissue edema. No acute osseous abnormality.

## 2021-07-16 ENCOUNTER — Other Ambulatory Visit: Payer: Self-pay | Admitting: Family Medicine

## 2021-07-16 DIAGNOSIS — M67922 Unspecified disorder of synovium and tendon, left upper arm: Secondary | ICD-10-CM

## 2021-07-21 ENCOUNTER — Ambulatory Visit (INDEPENDENT_AMBULATORY_CARE_PROVIDER_SITE_OTHER): Payer: Managed Care, Other (non HMO)

## 2021-07-21 DIAGNOSIS — M67922 Unspecified disorder of synovium and tendon, left upper arm: Secondary | ICD-10-CM

## 2021-07-21 DIAGNOSIS — M79622 Pain in left upper arm: Secondary | ICD-10-CM | POA: Diagnosis not present
